# Patient Record
Sex: Male | Born: 1990 | Race: White | Hispanic: No | Smoking: Never smoker
Health system: Southern US, Community
[De-identification: ages and names within clinical notes are randomized; demographics above are authoritative.]

## PROBLEM LIST (undated history)

## (undated) DIAGNOSIS — J45909 Unspecified asthma, uncomplicated: Secondary | ICD-10-CM

---

## 2018-02-05 ENCOUNTER — Emergency Department (HOSPITAL_COMMUNITY)

## 2018-02-05 ENCOUNTER — Emergency Department (HOSPITAL_COMMUNITY)
Admission: EM | Admit: 2018-02-05 | Discharge: 2018-02-05 | Disposition: A | Discharge status: Home / Self Care | Attending: Emergency Medicine | Admitting: Emergency Medicine

## 2018-02-05 ENCOUNTER — Other Ambulatory Visit: Payer: Self-pay

## 2018-02-05 ENCOUNTER — Encounter (HOSPITAL_COMMUNITY): Payer: Self-pay | Admitting: Emergency Medicine

## 2018-02-05 DIAGNOSIS — J4541 Moderate persistent asthma with (acute) exacerbation: Secondary | ICD-10-CM | POA: Diagnosis not present

## 2018-02-05 DIAGNOSIS — R0602 Shortness of breath: Secondary | ICD-10-CM | POA: Diagnosis present

## 2018-02-05 DIAGNOSIS — R109 Unspecified abdominal pain: Secondary | ICD-10-CM | POA: Diagnosis not present

## 2018-02-05 HISTORY — DX: Unspecified asthma, uncomplicated: J45.909

## 2018-02-05 LAB — CBC
HCT: 46 % (ref 39.0–52.0)
HEMOGLOBIN: 15.7 g/dL (ref 13.0–17.0)
MCH: 29.9 pg (ref 26.0–34.0)
MCHC: 34.1 g/dL (ref 30.0–36.0)
MCV: 87.6 fL (ref 78.0–100.0)
PLATELETS: 222 10*3/uL (ref 150–400)
RBC: 5.25 MIL/uL (ref 4.22–5.81)
RDW: 12.9 % (ref 11.5–15.5)
WBC: 6.2 10*3/uL (ref 4.0–10.5)

## 2018-02-05 LAB — BASIC METABOLIC PANEL
ANION GAP: 9 (ref 5–15)
BUN: 11 mg/dL (ref 6–20)
CO2: 25 mmol/L (ref 22–32)
CREATININE: 1.09 mg/dL (ref 0.61–1.24)
Calcium: 9.1 mg/dL (ref 8.9–10.3)
Chloride: 102 mmol/L (ref 101–111)
GFR calc Af Amer: >60 mL/min (ref >60)
Glucose, Bld: 103 mg/dL — ABNORMAL HIGH (ref 65–99)
Potassium: 4 mmol/L (ref 3.5–5.1)
SODIUM: 136 mmol/L (ref 135–145)

## 2018-02-05 LAB — URINALYSIS, ROUTINE W REFLEX MICROSCOPIC
BILIRUBIN URINE: NEGATIVE
Glucose, UA: NEGATIVE mg/dL
Hgb urine dipstick: NEGATIVE
KETONES UR: NEGATIVE mg/dL
LEUKOCYTES UA: NEGATIVE
Nitrite: NEGATIVE
PH: 5 (ref 5.0–8.0)
PROTEIN: NEGATIVE mg/dL
Specific Gravity, Urine: 1.023 (ref 1.005–1.030)

## 2018-02-05 MED ORDER — KETOROLAC TROMETHAMINE 30 MG/ML IJ SOLN
30.0000 mg | Freq: Once | INTRAMUSCULAR | Status: AC
  Administered 2018-02-05: 30 mg via INTRAMUSCULAR
  Filled 2018-02-05: qty 1

## 2018-02-05 MED ORDER — ALBUTEROL SULFATE HFA 108 (90 BASE) MCG/ACT IN AERS
2.0000 | INHALATION_SPRAY | RESPIRATORY_TRACT | Status: DC | PRN
  Administered 2018-02-05: 2 via RESPIRATORY_TRACT
  Filled 2018-02-05: qty 6.7

## 2018-02-05 MED ORDER — IPRATROPIUM-ALBUTEROL 0.5-2.5 (3) MG/3ML IN SOLN
3.0000 mL | Freq: Once | RESPIRATORY_TRACT | Status: AC
  Administered 2018-02-05: 3 mL via RESPIRATORY_TRACT
  Filled 2018-02-05: qty 3

## 2018-02-05 MED ORDER — PREDNISONE 20 MG PO TABS: 40.0000 mg | ORAL_TABLET | Freq: Every day | ORAL | 0 refills | Status: DC

## 2018-02-05 MED ORDER — ALBUTEROL SULFATE (2.5 MG/3ML) 0.083% IN NEBU
5.0000 mg | INHALATION_SOLUTION | Freq: Once | RESPIRATORY_TRACT | Status: AC
  Administered 2018-02-05: 5 mg via RESPIRATORY_TRACT
  Filled 2018-02-05: qty 6

## 2018-02-05 MED ORDER — PREDNISONE 50 MG PO TABS
50.0000 mg | ORAL_TABLET | Freq: Once | ORAL | Status: AC
  Administered 2018-02-05: 50 mg via ORAL
  Filled 2018-02-05: qty 1

## 2018-02-05 NOTE — ED Provider Notes (Signed)
[facility]-EMERGENCY DEPT Provider Note   CSN: [id number] Arrival date & time: 02/05/18  [id number]     History   Chief Complaint Chief Complaint  Patient presents with  . Shortness of Breath    HPI Juan Silva is a 27 y.o. male.  27 year old male with past medical history including asthma and kidney stones who presents with shortness of breath.  He reports that he has been having ongoing problems with shortness of breath related to asthma for the past 2 weeks for which she has been using albuterol more frequently.  His shortness of breath became much worse last night.  He ran out of his albuterol around 11 AM yesterday.  He reports associated chest heaviness that improves when he gets a breathing treatment.  He denies any significant cough and no fevers.  He did start having sore throat this morning. No recent travel, leg swelling, history of blood clots, or history of cancer.  So notes that about 8-9 hours ago he began having right-sided flank pain and thought he noticed some blood in his urine this morning.  He reports extensive history of kidney stones and states this feels similar.   The history is provided by the patient.  Shortness of Breath     Past Medical History:  Diagnosis Date  . Asthma     There are no active problems to display for this patient.   History reviewed. No pertinent surgical history.     Home Medications    Prior to Admission medications   Medication Sig Start Date End Date Taking? Authorizing Provider  acetaminophen (TYLENOL) 500 MG tablet Take 2,000 mg by mouth 2 (two) times daily as needed for mild pain.   Yes [provider]  albuterol (PROVENTIL HFA;VENTOLIN HFA) 108 (90 Base) MCG/ACT inhaler Inhale 2 puffs into the lungs every 6 (six) hours as needed for wheezing or shortness of breath.   Yes [provider]  bismuth subsalicylate (PEPTO BISMOL) 262 MG/15ML suspension Take 30 mLs by mouth every 4 (four)  hours as needed for indigestion.   Yes [provider]  predniSONE (DELTASONE) 20 MG tablet Take 2 tablets (40 mg total) by mouth daily. 02/05/18   Kelsei Defino, Ambrose Finlandachel Morgan, MD    Family History History reviewed. No pertinent family history.  Social History Social History   Tobacco Use  . Smoking status: Never Smoker  . Smokeless tobacco: Never Used  Substance Use Topics  . Alcohol use: Yes  . Drug use: No     Allergies   Patient has no known allergies.   Review of Systems Review of Systems  Respiratory: Positive for shortness of breath.    All other systems reviewed and are negative except that which was mentioned in HPI   Physical Exam Updated Vital Signs BP (!) 152/81 (BP Location: Left Arm)   Pulse 97   Temp 97.8 F (36.6 C) (Oral)   Resp 18   SpO2 97%   Physical Exam  Constitutional: He is oriented to person, place, and time. He appears well-developed and well-nourished. No distress.  HENT:  Head: Normocephalic and atraumatic.  Mouth/Throat: Oropharynx is clear and moist. No oropharyngeal exudate or posterior oropharyngeal edema.  Moist mucous membranes  Eyes: Conjunctivae are normal. Pupils are equal, round, and reactive to light.  Neck: Neck supple.  Cardiovascular: Normal rate, regular rhythm and normal heart sounds.  No murmur heard. Pulmonary/Chest: Effort normal.  Expiratory wheezes b/l with diminished BS b/l bases  Abdominal: Soft.  Bowel sounds are normal. He exhibits no distension. There is no tenderness.  Musculoskeletal: He exhibits no edema.  Neurological: He is alert and oriented to person, place, and time.  Fluent speech  Skin: Skin is warm and dry.  Psychiatric: He has a normal mood and affect. Judgment normal.  Nursing note and vitals reviewed.    ED Treatments / Results  Labs (all labs ordered are listed, but only abnormal results are displayed) Labs Reviewed  URINALYSIS, ROUTINE W REFLEX MICROSCOPIC - Abnormal; Notable for  the following components:      Result Value   Bacteria, UA RARE (*)    Squamous Epithelial / LPF 0-5 (*)    All other components within normal limits  BASIC METABOLIC PANEL - Abnormal; Notable for the following components:   Glucose, Bld 103 (*)    All other components within normal limits  CBC    EKG  EKG Interpretation None       Radiology Dg Chest 2 View  Result Date: 02/05/2018 CLINICAL DATA:  Shortness of breath EXAM: CHEST - 2 VIEW COMPARISON:  None. FINDINGS: The heart size and mediastinal contours are within normal limits. Both lungs are clear. The visualized skeletal structures are unremarkable. IMPRESSION: Normal chest. Electronically Signed   By: Deatra Robinson M.D.   On: 02/05/2018 06:54    Procedures Procedures (including critical care time)  Medications Ordered in ED Medications  albuterol (PROVENTIL HFA;VENTOLIN HFA) 108 (90 Base) MCG/ACT inhaler 2 puff (not administered)  albuterol (PROVENTIL) (2.5 MG/3ML) 0.083% nebulizer solution 5 mg (5 mg Nebulization Given by Other 02/05/18 0630)  predniSONE (DELTASONE) tablet 50 mg (50 mg Oral Given 02/05/18 0816)  ipratropium-albuterol (DUONEB) 0.5-2.5 (3) MG/3ML nebulizer solution 3 mL (3 mLs Nebulization Given 02/05/18 0818)  ketorolac (TORADOL) 30 MG/ML injection 30 mg (30 mg Intramuscular Given 02/05/18 0924)     Initial Impression / Assessment and Plan / ED Course  I have reviewed the triage vital signs and the nursing notes.  Pertinent labs & imaging results that were available during my care of the patient were reviewed by me and considered in my medical decision making (see chart for details).    Pt non-toxic on exam with reassuring vital signs.  He did have expiratory wheezes bilaterally and his story suggests that he has poorly controlled asthma at baseline, likely needs to be on inhaled corticosteroids.  Gave breathing treatments, prednisone as well as Toradol for his flank pain.  Lab work shows no evidence of  infection or blood in urine, normal CBC and BMP with normal creatinine.  I discussed that it is very unlikely that he has large obstructing stone or any infected stone based on this reassuring workup.  Regarding his asthma, he was much improved after 2 breathing treatments in the ED.  He felt much better.  Pulse ox 96% on reassessment.  Provided with inhaler to use at home and discussed supportive measures.  Reviewed return precautions.  Final Clinical Impressions(s) / ED Diagnoses   Final diagnoses:  Moderate persistent asthma with exacerbation  Right flank pain    ED Discharge Orders        Ordered    predniSONE (DELTASONE) 20 MG tablet  Daily     02/05/18 1005       Anabelen Kaminsky, Ambrose Finland, MD 02/05/18 1006

## 2018-02-05 NOTE — ED Triage Notes (Signed)
Pt presents to the ED with complaints of shortness of breath. Patient states it began last night when they called 911. EMS was able to manage symptoms so patient decided against transport. Over the past few hours symptoms have gotten worse. Patient also complaining of right flank pain. Patient states he has asthma and lung damage from being in [place] [employer]

## 2018-03-11 ENCOUNTER — Emergency Department (HOSPITAL_COMMUNITY)
Admission: EM | Admit: 2018-03-11 | Discharge: 2018-03-12 | Disposition: A | Discharge status: Home / Self Care | Attending: Emergency Medicine | Admitting: Emergency Medicine

## 2018-03-11 ENCOUNTER — Encounter (HOSPITAL_COMMUNITY): Payer: Self-pay | Admitting: Emergency Medicine

## 2018-03-11 DIAGNOSIS — J4521 Mild intermittent asthma with (acute) exacerbation: Secondary | ICD-10-CM | POA: Insufficient documentation

## 2018-03-11 DIAGNOSIS — R0602 Shortness of breath: Secondary | ICD-10-CM | POA: Diagnosis present

## 2018-03-11 MED ORDER — ALBUTEROL SULFATE (2.5 MG/3ML) 0.083% IN NEBU
5.0000 mg | INHALATION_SOLUTION | Freq: Once | RESPIRATORY_TRACT | Status: AC
  Administered 2018-03-11: 5 mg via RESPIRATORY_TRACT
  Filled 2018-03-11: qty 6

## 2018-03-11 NOTE — ED Triage Notes (Signed)
Pt presents via EMS for shortness of breath related to asthma. EMS reports giving 125mg  Solu-Medrol IVP and 1 Duoneb during transport with improvement in breathing.  Pt reports that shortness of breath has been ongoing for the last 4-5 days with worsening over the last 24 hours.

## 2018-03-12 ENCOUNTER — Emergency Department (HOSPITAL_COMMUNITY)

## 2018-03-12 MED ORDER — ALBUTEROL SULFATE HFA 108 (90 BASE) MCG/ACT IN AERS: 2.0000 | INHALATION_SPRAY | RESPIRATORY_TRACT | 0 refills | Status: AC | PRN

## 2018-03-12 MED ORDER — PREDNISONE 20 MG PO TABS
60.0000 mg | ORAL_TABLET | Freq: Every day | ORAL | 0 refills | Status: AC
Start: 2018-03-12 — End: ?

## 2018-03-12 MED ORDER — ALBUTEROL (5 MG/ML) CONTINUOUS INHALATION SOLN
15.0000 mg/h | INHALATION_SOLUTION | Freq: Once | RESPIRATORY_TRACT | Status: AC
  Administered 2018-03-12: 15 mg/h via RESPIRATORY_TRACT
  Filled 2018-03-12: qty 20

## 2018-03-12 MED ORDER — ALBUTEROL SULFATE HFA 108 (90 BASE) MCG/ACT IN AERS
2.0000 | INHALATION_SPRAY | Freq: Once | RESPIRATORY_TRACT | Status: AC
  Administered 2018-03-12: 2 via RESPIRATORY_TRACT
  Filled 2018-03-12: qty 6.7

## 2018-03-12 MED ORDER — IPRATROPIUM BROMIDE 0.02 % IN SOLN
1.0000 mg | Freq: Once | RESPIRATORY_TRACT | Status: AC
  Administered 2018-03-12: 1 mg via RESPIRATORY_TRACT
  Filled 2018-03-12: qty 5

## 2018-03-12 NOTE — ED Notes (Signed)
Respiratory called to start continuous neb

## 2018-03-12 NOTE — ED Provider Notes (Signed)
TIME SEEN: 12:30 AM  CHIEF COMPLAINT: Shortness of breath, wheezing  HPI: Patient is a 27 year old male with history of asthma, seasonal allergies who presents to the emergency department with shortness of breath, wheezing for the past 2-3 days.  States he has had a cough with pink sputum.  No fever.  No chest pain.  States that seasonal allergies normally cause him to have asthma attacks but he states he has never had symptoms this severe where he has had to use this much albuterol.  He has a primary care follow-up with Dr. Parke SimmersBland scheduled on May 17.  ROS: See HPI Constitutional: no fever  Eyes: no drainage  ENT: no runny nose   Cardiovascular:  no chest pain  Resp:  SOB  GI: no vomiting GU: no dysuria Integumentary: no rash  Allergy: no hives  Musculoskeletal: no leg swelling  Neurological: no slurred speech ROS otherwise negative  PAST MEDICAL HISTORY/PAST SURGICAL HISTORY:  Past Medical History:  Diagnosis Date  . Asthma     MEDICATIONS:  Prior to Admission medications   Medication Sig Start Date End Date Taking? Authorizing Provider  acetaminophen (TYLENOL) 500 MG tablet Take 2,000 mg by mouth 2 (two) times daily as needed for mild pain.   Yes [provider]  albuterol (PROVENTIL HFA;VENTOLIN HFA) 108 (90 Base) MCG/ACT inhaler Inhale 2 puffs into the lungs every 6 (six) hours as needed for wheezing or shortness of breath.   Yes [provider]  predniSONE (DELTASONE) 20 MG tablet Take 2 tablets (40 mg total) by mouth daily. Patient not taking: Reported on 03/11/2018 02/05/18   Little, Ambrose Finlandachel Morgan, MD    ALLERGIES:  No Known Allergies  SOCIAL HISTORY:  Social History   Tobacco Use  . Smoking status: Never Smoker  . Smokeless tobacco: Never Used  Substance Use Topics  . Alcohol use: Yes    FAMILY HISTORY: History reviewed. No pertinent family history.  EXAM: BP (!) 135/116 (BP Location: Right Arm)   Pulse 92   Temp 98.6 F (37 C) (Oral)    Resp 20   Ht 5\' 11"  (1.803 m)   Wt 99.8 kg (220 lb)   SpO2 96%   BMI 30.68 kg/m  CONSTITUTIONAL: Alert and oriented and responds appropriately to questions. Well-appearing; well-nourished HEAD: Normocephalic EYES: Conjunctivae clear, pupils appear equal, EOMI ENT: normal nose; moist mucous membranes NECK: Supple, no meningismus, no nuchal rigidity, no LAD  CARD: RRR; S1 and S2 appreciated; no murmurs, no clicks, no rubs, no gallops RESP: Normal chest excursion without splinting or tachypnea; breath sounds equal bilaterally but he does have inspiratory and expiratory wheezes heard diffusely with diminished aeration at his bases bilaterally, no rhonchi, no rales, no hypoxia or respiratory distress, speaking full sentences ABD/GI: Normal bowel sounds; non-distended; soft, non-tender, no rebound, no guarding, no peritoneal signs, no hepatosplenomegaly BACK:  The back appears normal and is non-tender to palpation, there is no CVA tenderness EXT: Normal ROM in all joints; non-tender to palpation; no edema; normal capillary refill; no cyanosis, no calf tenderness or swelling    SKIN: Normal color for age and race; warm; no rash NEURO: Moves all extremities equally PSYCH: The patient's mood and manner are appropriate. Grooming and personal hygiene are appropriate.  MEDICAL DECISION MAKING: Patient here with asthma exacerbation.  Given he is coughing up pink sputum will obtain chest x-ray to evaluate for pneumonia.  Doubt pneumothorax.  No sign of volume overload.  Doubt pulmonary embolus.  Will give continuous albuterol  treatment.  He has received Solu-Medrol with EMS.  ED PROGRESS: Chest x-ray shows mild peribronchial thickening but otherwise no acute abnormalities.  Patient's lungs are completely clear.  He reports feeling much better.  Will discharge with albuterol inhaler from the ED as well as prescription for prednisone burst.  He has outpatient PCP follow-up.  Discussed with him that he may  need to see an allergy specialist and may need a nebulizer machine at home which his primary care physician can set him up with.  He verbalized understanding and is comfortable with this plan.   At this time, I do not feel there is any life-threatening condition present. I have reviewed and discussed all results (EKG, imaging, lab, urine as appropriate) and exam findings with patient/family. I have reviewed nursing notes and appropriate previous records.  I feel the patient is safe to be discharged home without further emergent workup and can continue workup as an outpatient as needed. Discussed usual and customary return precautions. Patient/family verbalize understanding and are comfortable with this plan.  Outpatient follow-up has been provided if needed. All questions have been answered.  CRITICAL CARE Performed by: Rochele Raring   Total critical care time: 35 minutes  Critical care time was exclusive of separately billable procedures and treating other patients.  Critical care was necessary to treat or prevent imminent or life-threatening deterioration.  Critical care was time spent personally by me on the following activities: development of treatment plan with patient and/or surrogate as well as nursing, discussions with consultants, evaluation of patient's response to treatment, examination of patient, obtaining history from patient or surrogate, ordering and performing treatments and interventions, ordering and review of laboratory studies, ordering and review of radiographic studies, pulse oximetry and re-evaluation of patient's condition.    Carrell Palmatier, Layla Maw, DO 03/12/18 [phone]

## 2018-11-18 IMAGING — CR DG CHEST 2V
2 series · 2 of 2 positions shown · non-contrast
Comparison: None.

CLINICAL DATA: Shortness of breath

EXAM:
CHEST - 2 VIEW

[w chest pa]
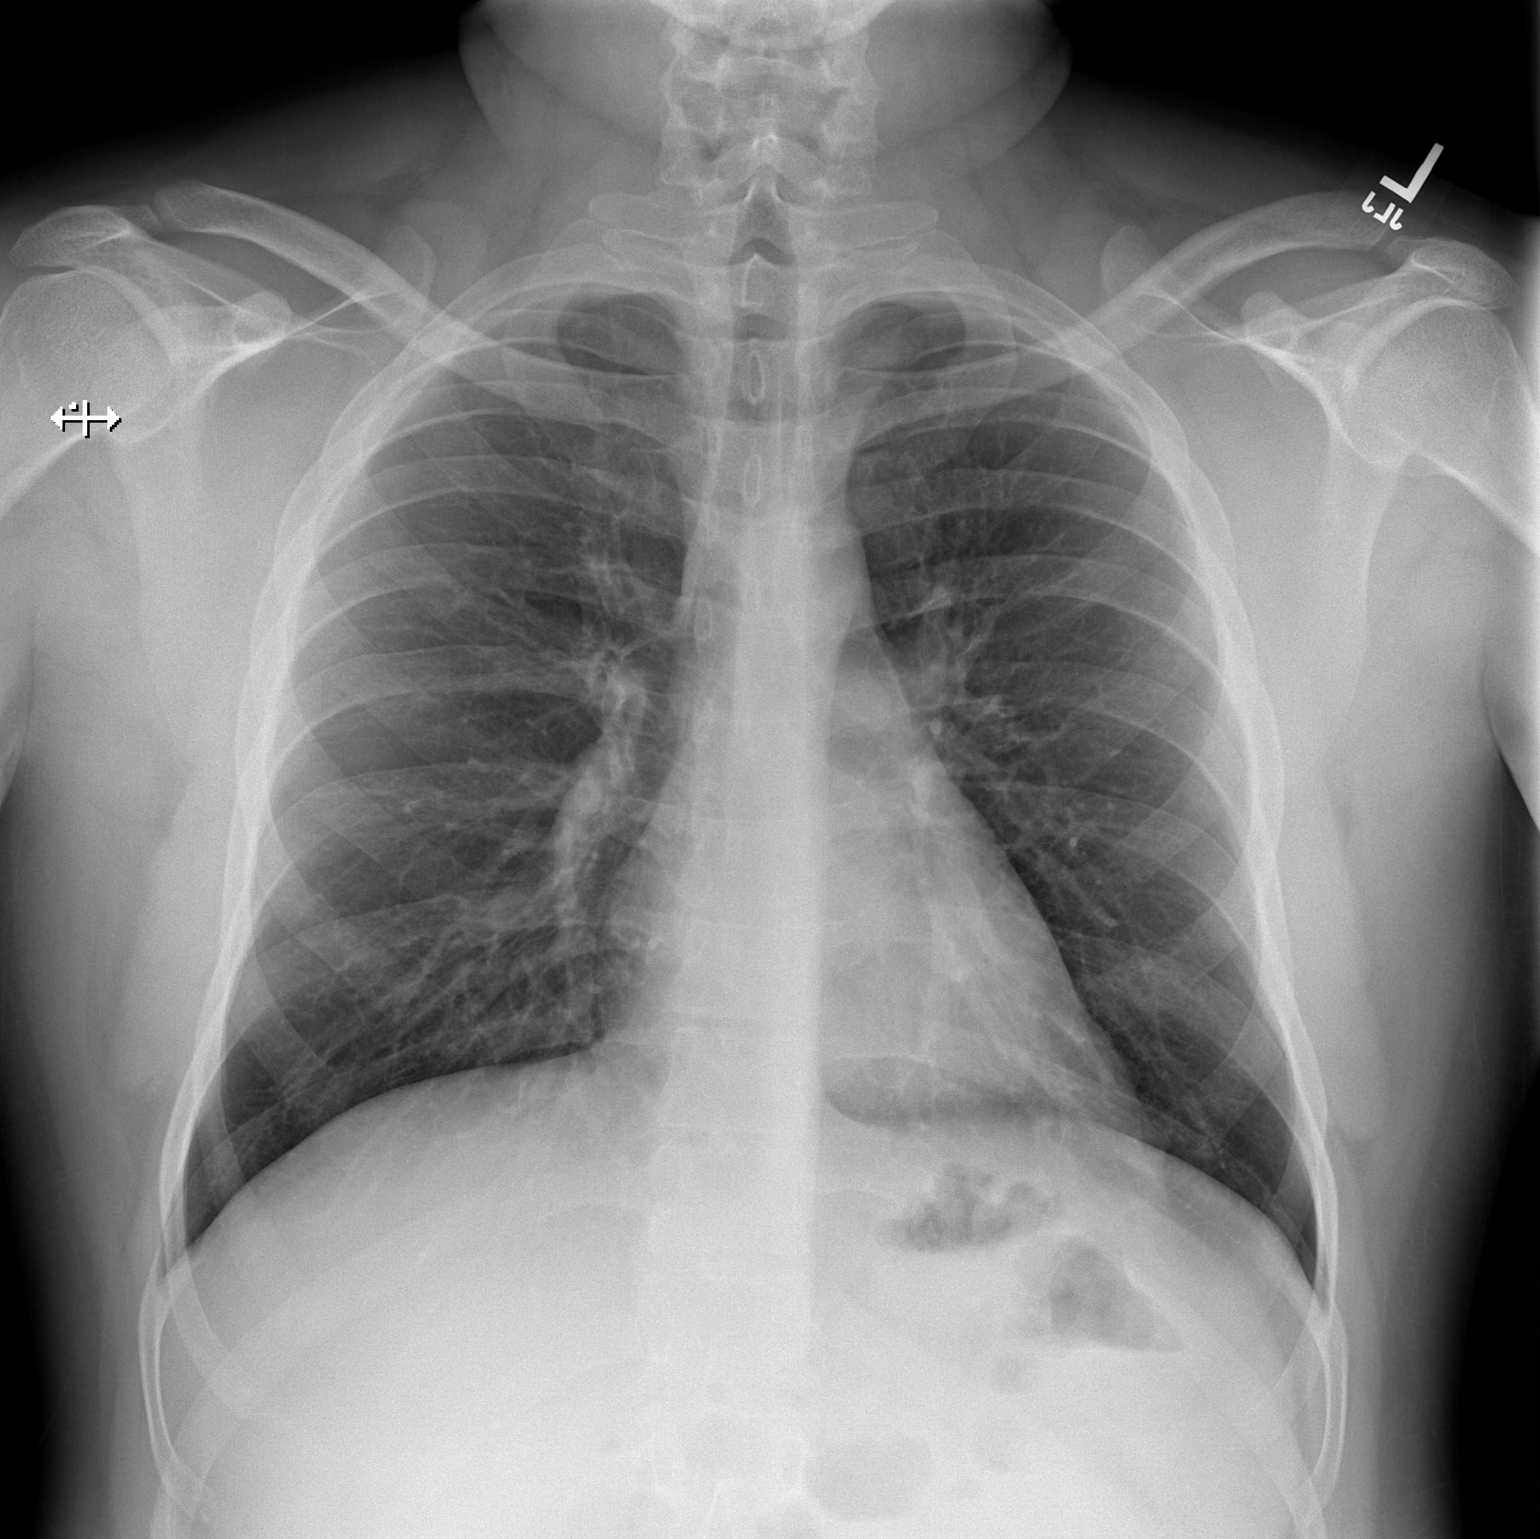

[w chest lat]
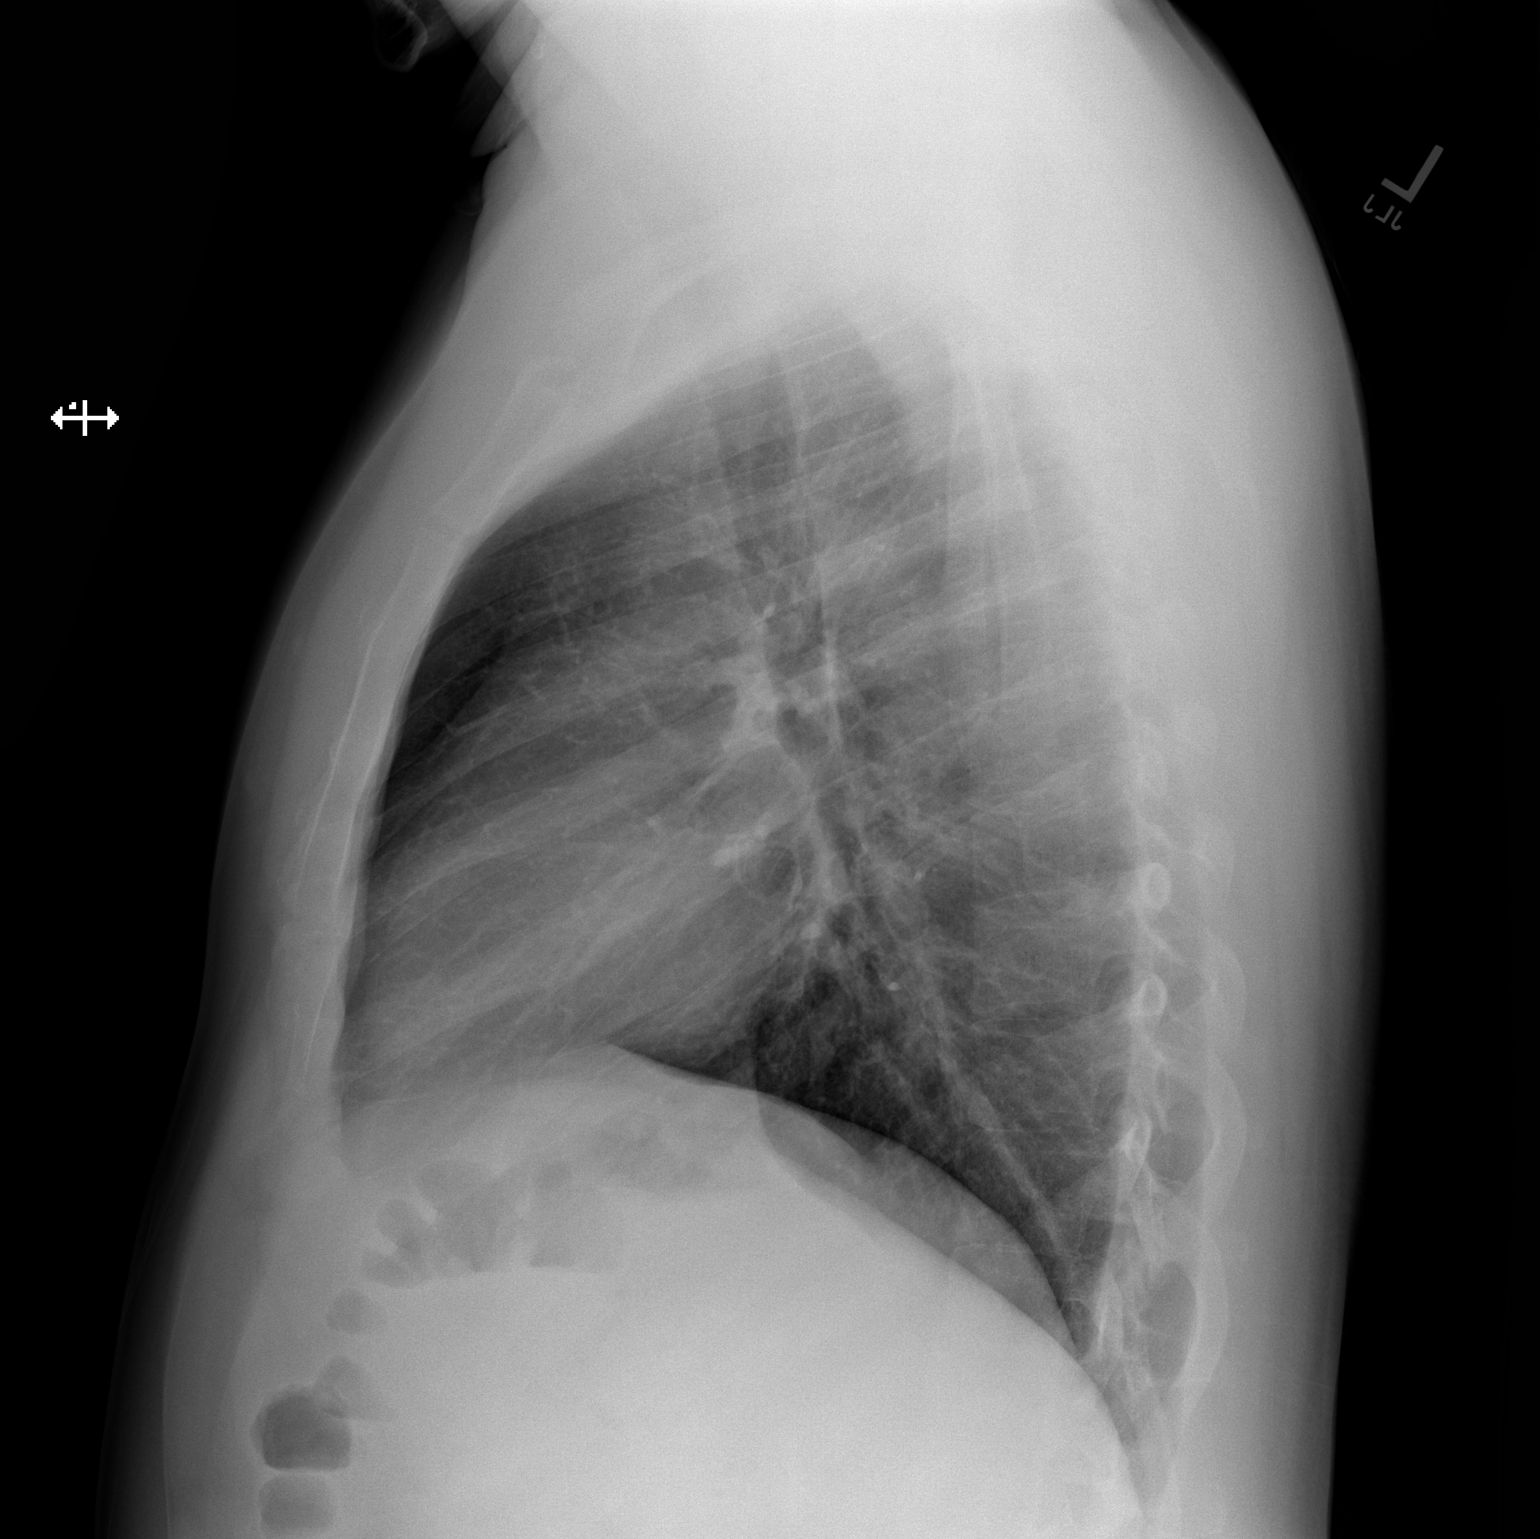

[2 of 2 positions shown; findings below may reference images not displayed]

FINDINGS: The heart size and mediastinal contours are within normal limits.
Both lungs are clear. The visualized skeletal structures are
unremarkable.
IMPRESSION: Normal chest.
